# Patient Record
Sex: Male | Born: 1990 | Race: Black or African American | Hispanic: No | Marital: Single | Smoking: Current some day smoker
Health system: Southern US, Community
[De-identification: ages and names within clinical notes are randomized; demographics above are authoritative.]

## PROBLEM LIST (undated history)

## (undated) ENCOUNTER — Emergency Department (HOSPITAL_COMMUNITY)
Discharge status: Against Medical Advice | Payer: Medicaid Other | Attending: Emergency Medicine | Admitting: Emergency Medicine

## (undated) HISTORY — PX: CARDIAC SURGERY: SHX584

---
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
## 2013-01-17 IMAGING — CR DG SHOULDER 2+V*R*
3 series · 3 of 3 positions shown · non-contrast
Comparison: None.

CLINICAL DATA: Fall.  Shoulder injury with limited range of motion.

RIGHT SHOULDER - 2+ VIEW

[view not recorded (1 of 3)]
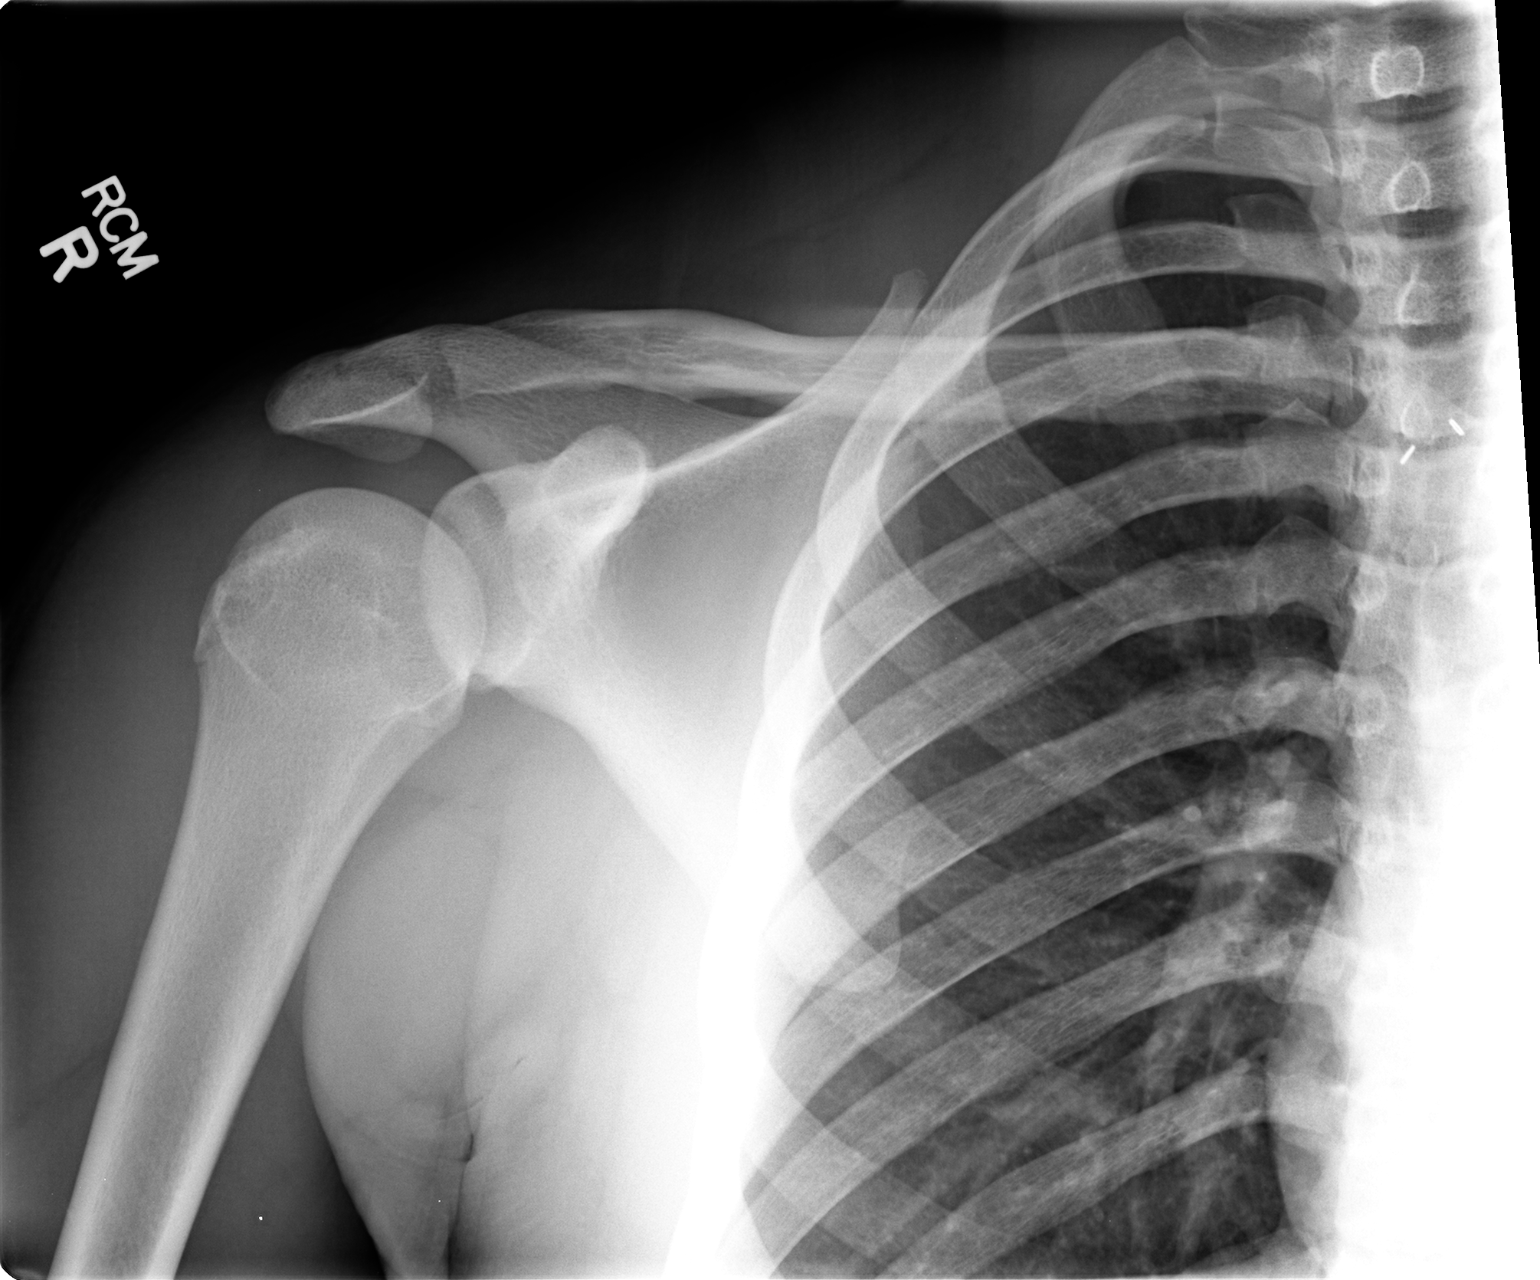

[view not recorded (2 of 3)]
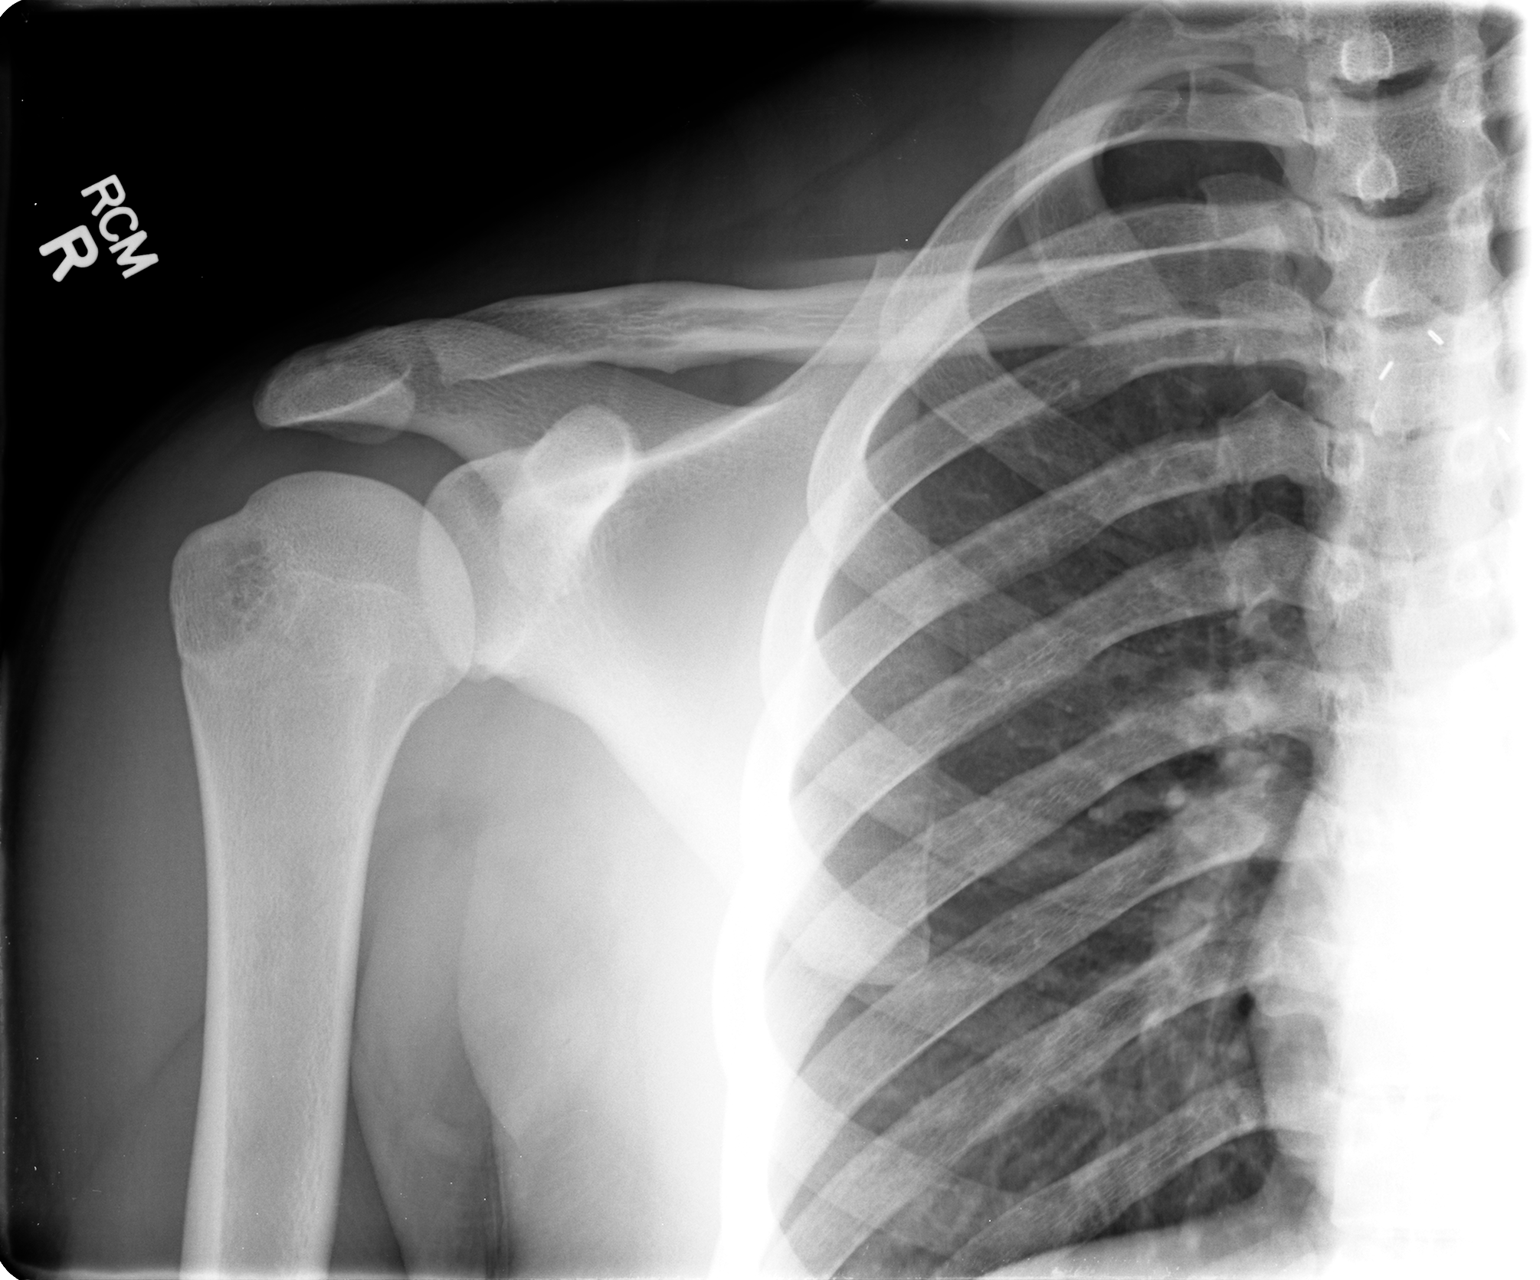

[view not recorded (3 of 3)]
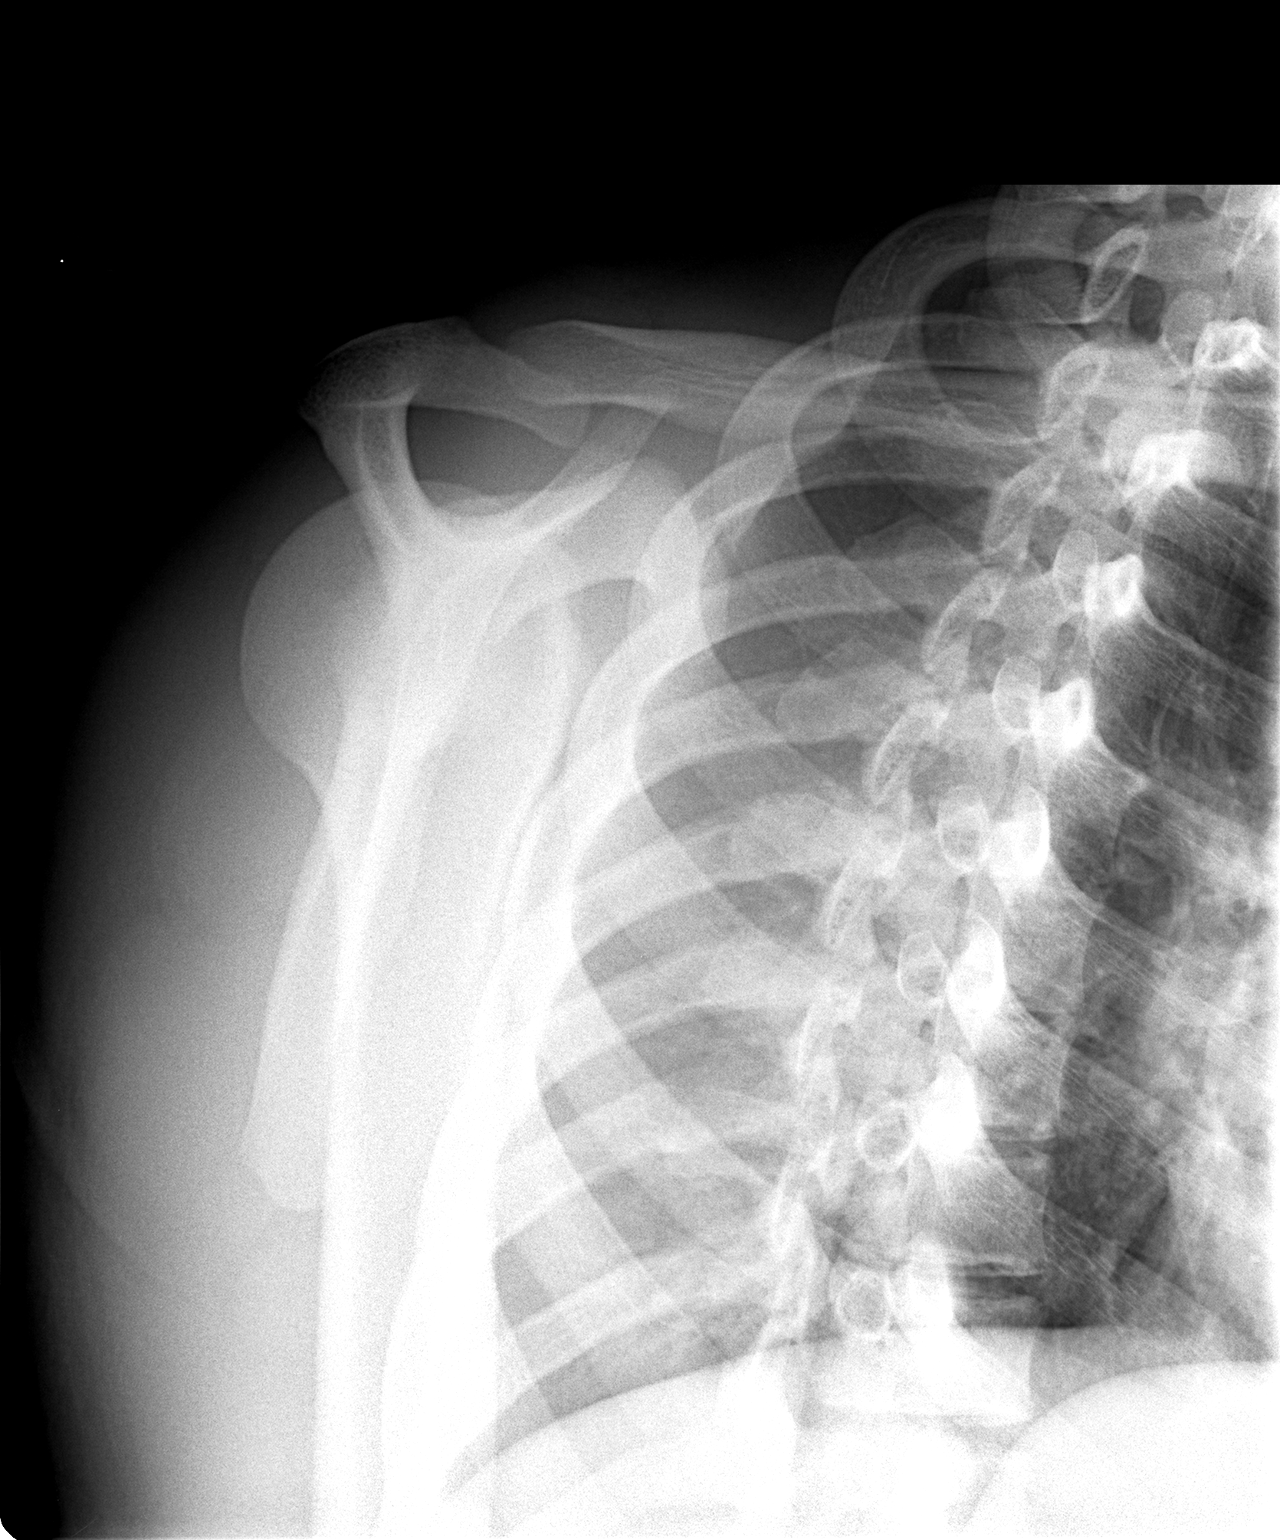

[3 of 3 positions shown; findings below may reference images not displayed]

FINDINGS: Slight irregularity of the acromion likely relates to the
acromial apophysis, which is normal at this age.  No definite AC
joint malalignment.  No fracture is identified.
IMPRESSION: No significant abnormality identified.

## 2013-11-01 ENCOUNTER — Encounter (HOSPITAL_COMMUNITY): Payer: Self-pay | Admitting: Emergency Medicine

## 2013-11-01 ENCOUNTER — Other Ambulatory Visit (HOSPITAL_COMMUNITY)
Admission: RE | Admit: 2013-11-01 | Discharge: 2013-11-01 | Disposition: A | Discharge status: Home / Self Care | Payer: 59 | Source: Ambulatory Visit | Attending: Family Medicine | Admitting: Family Medicine

## 2013-11-01 ENCOUNTER — Emergency Department (INDEPENDENT_AMBULATORY_CARE_PROVIDER_SITE_OTHER)
Admission: EM | Admit: 2013-11-01 | Discharge: 2013-11-01 | Disposition: A | Discharge status: Home / Self Care | Payer: 59 | Source: Home / Self Care

## 2013-11-01 DIAGNOSIS — Z202 Contact with and (suspected) exposure to infections with a predominantly sexual mode of transmission: Secondary | ICD-10-CM

## 2013-11-01 DIAGNOSIS — Z113 Encounter for screening for infections with a predominantly sexual mode of transmission: Secondary | ICD-10-CM | POA: Insufficient documentation

## 2013-11-01 LAB — POCT URINALYSIS DIP (DEVICE)
GLUCOSE, UA: NEGATIVE mg/dL
HGB URINE DIPSTICK: NEGATIVE
Ketones, ur: NEGATIVE mg/dL
LEUKOCYTES UA: NEGATIVE
NITRITE: NEGATIVE
PH: 7 (ref 5.0–8.0)
PROTEIN: NEGATIVE mg/dL
SPECIFIC GRAVITY, URINE: 1.025 (ref 1.005–1.030)
Urobilinogen, UA: 1 mg/dL (ref 0.0–1.0)

## 2013-11-01 NOTE — ED Notes (Signed)
Pt not in waiting area 

## 2013-11-01 NOTE — ED Provider Notes (Signed)
CSN: [id number]     Arrival date & time 11/01/13  1209 History   None    Chief Complaint  Patient presents with  . Exposure to STD   (Consider location/radiation/quality/duration/timing/severity/associated sxs/prior Treatment) Patient is a 23 y.o. male presenting with STD exposure. The history is provided by the patient.  Exposure to STD This is a new problem. Episode onset: told yest by girl that he should be checked for std. The problem has been gradually worsening (sl dysuria, no rash,lesions or d/c.). Pertinent negatives include no chest pain and no abdominal pain.    History reviewed. No pertinent past medical history. History reviewed. No pertinent past surgical history. No family history on file. History  Substance Use Topics  . Smoking status: Current Every Day Smoker -- 0.50 packs/day  . Smokeless tobacco: Not on file  . Alcohol Use: No    Review of Systems  Cardiovascular: Negative for chest pain.  Gastrointestinal: Negative for abdominal pain.  Genitourinary: Positive for dysuria. Negative for urgency, discharge, penile swelling, scrotal swelling, penile pain and testicular pain.    Allergies  Review of patient's allergies indicates no known allergies.  Home Medications  No current outpatient prescriptions on file. BP 111/62  Pulse 51  Temp(Src) 98.2 F (36.8 C) (Oral)  Resp 18  SpO2 100% Physical Exam  Nursing note and vitals reviewed. Constitutional: He is oriented to person, place, and time. He appears well-developed and well-nourished.  Abdominal: Soft. Bowel sounds are normal.  Genitourinary: Penis normal.  Neurological: He is alert and oriented to person, place, and time.  Skin: Skin is warm.    ED Course  Procedures (including critical care time) Labs Review Labs Reviewed  URINE CYTOLOGY ANCILLARY ONLY   Imaging Review No results found.   MDM   1. Possible exposure to STD        Linna HoffJames D Leiland Mihelich, MD 11/01/13 1357

## 2013-11-01 NOTE — Discharge Instructions (Signed)
We will call with test results and treat as indicated °

## 2013-11-01 NOTE — ED Notes (Signed)
Best number [phone]

## 2013-11-01 NOTE — ED Notes (Signed)
Pt reports he came in to be checked for STD. No recent exposure to STD. Reports he has noticed some discomfort while urinating and itching. Patient denies fever, n/v/d, or abdominal pain. Pt is alert and oriented and in no acute distress.

## 2013-11-02 LAB — URINE CYTOLOGY ANCILLARY ONLY
Chlamydia: NEGATIVE
Neisseria Gonorrhea: NEGATIVE
Trichomonas: NEGATIVE

## 2014-01-17 ENCOUNTER — Encounter (HOSPITAL_COMMUNITY): Payer: Self-pay | Admitting: Emergency Medicine

## 2014-01-17 ENCOUNTER — Emergency Department (HOSPITAL_COMMUNITY)
Admission: EM | Admit: 2014-01-17 | Discharge: 2014-01-17 | Disposition: A | Discharge status: Home / Self Care | Payer: 59 | Attending: Emergency Medicine | Admitting: Emergency Medicine

## 2014-01-17 DIAGNOSIS — B354 Tinea corporis: Secondary | ICD-10-CM | POA: Insufficient documentation

## 2014-01-17 DIAGNOSIS — Z79899 Other long term (current) drug therapy: Secondary | ICD-10-CM | POA: Diagnosis not present

## 2014-01-17 DIAGNOSIS — Z9889 Other specified postprocedural states: Secondary | ICD-10-CM | POA: Insufficient documentation

## 2014-01-17 DIAGNOSIS — F17200 Nicotine dependence, unspecified, uncomplicated: Secondary | ICD-10-CM | POA: Diagnosis not present

## 2014-01-17 DIAGNOSIS — R21 Rash and other nonspecific skin eruption: Secondary | ICD-10-CM | POA: Diagnosis present

## 2014-01-17 MED ORDER — FLUCONAZOLE 100 MG PO TABS
200.0000 mg | ORAL_TABLET | Freq: Once | ORAL | Status: AC
  Administered 2014-01-17: 200 mg via ORAL
  Filled 2014-01-17: qty 2

## 2014-01-17 MED ORDER — FLUCONAZOLE 150 MG PO TABS: 150.0000 mg | ORAL_TABLET | Freq: Every day | ORAL | Status: AC

## 2014-01-17 NOTE — ED Provider Notes (Signed)
CSN: [id number]     Arrival date & time 01/17/14  0030 History   First MD Initiated Contact with Patient 01/17/14 0058     Chief Complaint  Patient presents with  . Blister     (Consider location/radiation/quality/duration/timing/severity/associated sxs/prior Treatment) HPI Comments: Patient with multiple areas of itchy skin lesions noted around his right elbow, right upper arm and also back of neck. Patient states that they have started small and gradually grown larger. Patient has been scratching at the areas and picking at the areas. He has never had anything like this before. He denies new medications or skin exposures. He denies any lesions in his mouth. No known tick bites. No fever, nausea or vomiting. No treatments prior to arrival.  The history is provided by the patient.    History reviewed. No pertinent past medical history. Past Surgical History  Procedure Laterality Date  . Cardiac surgery     No family history on file. History  Substance Use Topics  . Smoking status: Current Some Day Smoker    Types: Cigarettes  . Smokeless tobacco: Not on file  . Alcohol Use: No    Review of Systems  Constitutional: Negative for fever.  HENT: Negative for facial swelling and trouble swallowing.   Eyes: Negative for redness.  Respiratory: Negative for shortness of breath, wheezing and stridor.   Cardiovascular: Negative for chest pain.  Gastrointestinal: Negative for nausea and vomiting.  Musculoskeletal: Negative for myalgias.  Skin: Positive for rash.  Neurological: Negative for light-headedness.  Psychiatric/Behavioral: Negative for confusion.    Allergies  Review of patient's allergies indicates no known allergies.  Home Medications   Prior to Admission medications   Medication Sig Start Date End Date Taking? Authorizing Provider  fluconazole (DIFLUCAN) 150 MG tablet Take 1 tablet (150 mg total) by mouth daily. 01/17/14   Renne CriglerJoshua Leshonda Galambos, PA-C  ibuprofen (ADVIL,MOTRIN)  200 MG tablet Take 400 mg by mouth every 6 (six) hours as needed. For pain.    Historical Provider, MD   BP 110/62  Pulse 58  Temp(Src) 98.1 F (36.7 C) (Oral)  Resp 18  Ht 5\' 8"  (1.727 m)  Wt 168 lb (76.204 kg)  BMI 25.55 kg/m2  SpO2 99%  Physical Exam  Nursing note and vitals reviewed. Constitutional: He appears well-developed and well-nourished.  HENT:  Head: Normocephalic and atraumatic.  Eyes: Conjunctivae are normal.  Neck: Normal range of motion. Neck supple.  Pulmonary/Chest: No respiratory distress.  Neurological: He is alert.  Skin: Skin is warm and dry.  Patient with multiple ovoid lesions with peripheral crusting and central clearing. He has several near his right elbow and right upper arm and a small location on the back of his neck. Areas are most consistent with tinea corporis. I do not see any areas of secondary infection or cellulitis.  Psychiatric: He has a normal mood and affect.    ED Course  Procedures (including critical care time) Labs Review Labs Reviewed - No data to display  Imaging Review No results found.   EKG Interpretation None      Vital signs reviewed and are as follows: Filed Vitals:   01/17/14 0045  BP: 110/62  Pulse: 58  Temp: 98.1 F (36.7 C)  Resp: 18     MDM   Final diagnoses:  Tinea corporis   Patient with signs and symptoms most consistent with ringworm. No concerning skin exposures. No signs of SJS or TEN. No signs of allergic reaction. No fevers or systemic symptoms  of illness.    Renne CriglerJoshua Taryll Reichenberger, PA-C 01/17/14 [phone]

## 2014-01-17 NOTE — Discharge Instructions (Signed)
Please read and follow all provided instructions.  Your diagnoses today include:  1. Tinea corporis     Tests performed today include:  Vital signs. See below for your results today.   Medications prescribed:   Diflucan - medication for fungal infection on body  Take any prescribed medications only as directed.  Home care instructions:  Follow any educational materials contained in this packet.  BE VERY CAREFUL not to take multiple medicines containing Tylenol (also called acetaminophen). Doing so can lead to an overdose which can damage your liver and cause liver failure and possibly death.   Follow-up instructions: Please follow-up with your primary care provider in the next 3 days for further evaluation of your symptoms.   Return instructions:   Please return to the Emergency Department if you experience worsening symptoms.   Please return if you have any other emergent concerns.  Additional Information:  Your vital signs today were: BP 110/62   Pulse 58   Temp(Src) 98.1 F (36.7 C) (Oral)   Resp 18   Ht 5\' 8"  (1.727 m)   Wt 168 lb (76.204 kg)   BMI 25.55 kg/m2   SpO2 99% If your blood pressure (BP) was elevated above 135/85 this visit, please have this repeated by your doctor within one month. --------------

## 2014-01-17 NOTE — ED Provider Notes (Signed)
Medical screening examination/treatment/procedure(s) were performed by non-physician practitioner and as supervising physician I was immediately available for consultation/collaboration.   EKG Interpretation None        Brandt LoosenJulie Manly, MD 01/17/14 2307

## 2014-01-17 NOTE — ED Notes (Signed)
The pt has a lesion to the rt  Elbow area that has been there for 4-5 days.  He has no idea what caused it

## 2014-06-07 ENCOUNTER — Encounter (HOSPITAL_COMMUNITY): Payer: Self-pay

## 2014-06-07 ENCOUNTER — Emergency Department (INDEPENDENT_AMBULATORY_CARE_PROVIDER_SITE_OTHER)
Admission: EM | Admit: 2014-06-07 | Discharge: 2014-06-07 | Disposition: A | Discharge status: Home / Self Care | Payer: BC Managed Care – PPO | Source: Home / Self Care | Attending: Family Medicine | Admitting: Family Medicine

## 2014-06-07 DIAGNOSIS — K629 Disease of anus and rectum, unspecified: Secondary | ICD-10-CM

## 2014-06-07 DIAGNOSIS — K602 Anal fissure, unspecified: Secondary | ICD-10-CM

## 2014-06-07 MED ORDER — POLYETHYLENE GLYCOL 3350 17 GM/SCOOP PO POWD: 17.0000 g | Freq: Every day | ORAL | Status: AC

## 2014-06-07 NOTE — ED Notes (Signed)
C/o ~ 10 day duration of episodic upper/transverse abdominal pain and blood in stools. NAD at present

## 2014-06-07 NOTE — ED Provider Notes (Addendum)
[name] is a 23 y.o. male who presents to Urgent Care today for Rectal bleeding. Patient had one or 2 episodes of bright red blood on his toilet paper over the past week or so. He denies any significant abdominal pain. No fevers or chills. Patient has had a few hard stools.   History reviewed. No pertinent past medical history. History reviewed. No pertinent past surgical history. History  Substance Use Topics  . Smoking status: Current Every Day Smoker -- 0.50 packs/day  . Smokeless tobacco: Not on file  . Alcohol Use: No   ROS as above Medications: No current facility-administered medications for this encounter.   Current Outpatient Prescriptions  Medication Sig Dispense Refill  . polyethylene glycol powder (GLYCOLAX/MIRALAX) powder Take 17 g by mouth daily. 850 g 1   No Known Allergies   Exam:  BP 103/65 mmHg  Pulse 54  Temp(Src) 97.6 F (36.4 C) (Oral)  Resp 16  SpO2 100% Gen: Well NAD HEENT: EOMI,  MMM Lungs: Normal work of breathing. CTABL Heart: mildly bradycardic but regular no MRG Abd: NABS, Soft. Nondistended, Nontender no rebound or guarding Exts: Brisk capillary refill, warm and well perfused.  Rectum: small anal fissure present at the 6:00 position.  No results found for this or any previous visit (from the past 24 hour(s)). No results found.  Assessment and Plan: 23 y.o. male with anal fissure. Treatment with near lax sitz bath and rest. follow-up with PCP.  Discussed warning signs or symptoms. Please see discharge instructions. Patient expresses understanding.     Rodolph BongEvan S Decari Duggar, MD 06/07/14 1129  Rodolph BongEvan S Lanier Millon, MD 06/07/14 [phone]

## 2014-06-07 NOTE — Discharge Instructions (Signed)
Thank you for coming in today. Follow up with your doctor.  If your belly pain worsens, or you have high fever, bad vomiting, blood in your stool or black tarry stool go to the Emergency Room.    Anal Fissure, Adult An anal fissure is a small tear or crack in the skin around the anus. Bleeding from a fissure usually stops on its own within a few minutes. However, bleeding will often reoccur with each bowel movement until the crack heals.  CAUSES   Passing large, hard stools.  Frequent diarrheal stools.  Constipation.  Inflammatory bowel disease (Crohn's disease or ulcerative colitis).  Infections.  Anal sex. SYMPTOMS   Small amounts of blood seen on your stools, on toilet paper, or in the toilet after a bowel movement.  Rectal bleeding.  Painful bowel movements.  Itching or irritation around the anus. DIAGNOSIS Your caregiver will examine the anal area. An anal fissure can usually be seen with careful inspection. A rectal exam may be performed and a short tube (anoscope) may be used to examine the anal canal. TREATMENT   You may be instructed to take fiber supplements. These supplements can soften your stool to help make bowel movements easier.  Sitz baths may be recommended to help heal the tear. Do not use soap in the sitz baths.  A medicated cream or ointment may be prescribed to lessen discomfort. HOME CARE INSTRUCTIONS   Maintain a diet high in fruits, whole grains, and vegetables. Avoid constipating foods like bananas and dairy products.  Take sitz baths as directed by your caregiver.  Drink enough fluids to keep your urine clear or pale yellow.  Only take over-the-counter or prescription medicines for pain, discomfort, or fever as directed by your caregiver. Do not take aspirin as this may increase bleeding.  Do not use ointments containing numbing medications (anesthetics) or hydrocortisone. They could slow healing. SEEK MEDICAL CARE IF:   Your fissure is  not completely healed within 3 days.  You have further bleeding.  You have a fever.  You have diarrhea mixed with blood.  You have pain.  Your problem is getting worse rather than better. MAKE SURE YOU:   Understand these instructions.  Will watch your condition.  Will get help right away if you are not doing well or get worse. Document Released: 07/09/2005 Document Revised: 10/01/2011 Document Reviewed: 12/24/2010 [facility] Patient Information 2015 [place], [place]. This information is not intended to replace advice given to you by your health care provider. Make sure you discuss any questions you have with your health care provider.  PRIMARY CARE Merchant navy officerDOCTORS [facility] [address]  [place], [place] Ph [phone]  Fax [phone]  Nature conservation officerLeBauer [facility] at [facility] [address]  [place], [place] Ph [phone]  Fax [phone]  Nature conservation officerLeBauer [facility]Care at [place] / [place] [phone]  [place], [place] Ph [phone]  Fax [phone]  [facility] [address]  [place], [place] Ph [phone]  Fax [phone]  [facility] [address] [place]  [place], [place] Ph [phone]  Fax [phone]  [facility] [address] [place]  [place], [place] Ph [phone]  Fax [phone]   [facility] [place] [address] [place] [address] Phone: [phone]   [facility] @ [facility] [address] [place] [address] Phone:  [phone]   [facility] @ [place] [address] [place] [address] Phone: [phone]   [facility] @ [place] [address] [place] [address] Phone: [phone]   [facility] @ [place] [address]  [address] [place] [address] Phone: [phone] Fax: [phone]   [facility] @ [place] [address] [place] [address] Phone: [phone]   Dr. Maryelizabeth RowanElizabeth Dewey [address] [place] [address] [phone]

## 2014-10-11 ENCOUNTER — Emergency Department (INDEPENDENT_AMBULATORY_CARE_PROVIDER_SITE_OTHER)
Admission: EM | Admit: 2014-10-11 | Discharge: 2014-10-11 | Disposition: A | Discharge status: Home / Self Care | Payer: Self-pay | Source: Home / Self Care | Attending: Emergency Medicine | Admitting: Emergency Medicine

## 2014-10-11 ENCOUNTER — Encounter (HOSPITAL_COMMUNITY): Payer: Self-pay | Admitting: Emergency Medicine

## 2014-10-11 DIAGNOSIS — L209 Atopic dermatitis, unspecified: Secondary | ICD-10-CM

## 2014-10-11 MED ORDER — TRIAMCINOLONE ACETONIDE 0.1 % EX CREA: 1.0000 "application " | TOPICAL_CREAM | Freq: Two times a day (BID) | CUTANEOUS | Status: AC

## 2014-10-11 NOTE — ED Provider Notes (Signed)
CSN: [id number]     Arrival date & time 10/11/14  1237 History   First MD Initiated Contact with Patient 10/11/14 1522     Chief Complaint  Patient presents with  . Eczema   (Consider location/radiation/quality/duration/timing/severity/associated sxs/prior Treatment) HPI Comments: 24 year old male with a history of eczema has about 1-2 flareups per year. Current flareup started about 2 months ago. He has been applying various OTC creams without improvement. He has darkened pigmentation and papules primarily to the flexor surfaces of bilateral elbows as well as headache circumferential band around his neck.   History reviewed. No pertinent past medical history. Past Surgical History  Procedure Laterality Date  . Cardiac surgery     No family history on file. History  Substance Use Topics  . Smoking status: Current Some Day Smoker    Types: Cigarettes  . Smokeless tobacco: Not on file  . Alcohol Use: No    Review of Systems  Constitutional: Negative.   Skin: Positive for rash.       Pruritic  Psychiatric/Behavioral: Negative.   All other systems reviewed and are negative.   Allergies  Review of patient's allergies indicates no known allergies.  Home Medications   Prior to Admission medications   Medication Sig Start Date End Date Taking? Authorizing Provider  fluconazole (DIFLUCAN) 150 MG tablet Take 1 tablet (150 mg total) by mouth daily. 01/17/14   Renne CriglerJoshua Geiple, PA-C  ibuprofen (ADVIL,MOTRIN) 200 MG tablet Take 400 mg by mouth every 6 (six) hours as needed. For pain.    Historical Provider, MD  triamcinolone cream (KENALOG) 0.1 % Apply 1 application topically 2 (two) times daily. 10/11/14   Hayden Rasmussenavid Analicia Skibinski, NP   BP 113/77 mmHg  Pulse 48  Temp(Src) 98.8 F (37.1 C) (Oral)  Resp 16  SpO2 96% Physical Exam  Constitutional: He is oriented to person, place, and time. He appears well-developed and well-nourished. No distress.  Neck: Normal range of motion. Neck supple.   Cardiovascular: Normal rate.   Pulmonary/Chest: Effort normal. No respiratory distress.  Musculoskeletal: He exhibits no edema.  Lymphadenopathy:    He has no cervical adenopathy.  Neurological: He is alert and oriented to person, place, and time. He exhibits normal muscle tone.  Skin: Skin is warm and dry.  Darkly pigmented and slightly rough rash to the flexor, lateral and medial aspects of the bilateral elbows. Similar-type rash around the neck.  Nursing note and vitals reviewed.   ED Course  Procedures (including critical care time) Labs Review Labs Reviewed - No data to display  Imaging Review No results found.   MDM   1. Atopic eczema    Triamcinolone twice a day as directed Apply emollients  such as non-fragrance skin lotions, frequently. Follow-up with a primary care doctor C first paging call the numbers to obtain one    Hayden Rasmussenavid Timouthy Gilardi, NP 10/11/14 1540

## 2014-10-11 NOTE — ED Notes (Signed)
C/o eczema flare up onset 2 months on both arms Alert, no signs of acute distress.

## 2014-10-11 NOTE — Discharge Instructions (Signed)
Eczema Eczema, also called atopic dermatitis, is a skin disorder that causes inflammation of the skin. It causes a red rash and dry, scaly skin. The skin becomes very itchy. Eczema is generally worse during the cooler winter months and often improves with the warmth of summer. Eczema usually starts showing signs in infancy. Some children outgrow eczema, but it may last through adulthood.  CAUSES  The exact cause of eczema is not known, but it appears to run in families. People with eczema often have a family history of eczema, allergies, asthma, or hay fever. Eczema is not contagious. Flare-ups of the condition may be caused by:   Contact with something you are sensitive or allergic to.   Stress. SIGNS AND SYMPTOMS  Dry, scaly skin.   Red, itchy rash.   Itchiness. This may occur before the skin rash and may be very intense.  DIAGNOSIS  The diagnosis of eczema is usually made based on symptoms and medical history. TREATMENT  Eczema cannot be cured, but symptoms usually can be controlled with treatment and other strategies. A treatment plan might include:  Controlling the itching and scratching.   Use over-the-counter antihistamines as directed for itching. This is especially useful at night when the itching tends to be worse.   Use over-the-counter steroid creams as directed for itching.   Avoid scratching. Scratching makes the rash and itching worse. It may also result in a skin infection (impetigo) due to a break in the skin caused by scratching.   Keeping the skin well moisturized with creams every day. This will seal in moisture and help prevent dryness. Lotions that contain alcohol and water should be avoided because they can dry the skin.   Limiting exposure to things that you are sensitive or allergic to (allergens).   Recognizing situations that cause stress.   Developing a plan to manage stress.  HOME CARE INSTRUCTIONS   Only take over-the-counter or  prescription medicines as directed by your health care provider.   Do not use anything on the skin without checking with your health care provider.   Keep baths or showers short (5 minutes) in warm (not hot) water. Use mild cleansers for bathing. These should be unscented. You may add nonperfumed bath oil to the bath water. It is best to avoid soap and bubble bath.   Immediately after a bath or shower, when the skin is still damp, apply a moisturizing ointment to the entire body. This ointment should be a petroleum ointment. This will seal in moisture and help prevent dryness. The thicker the ointment, the better. These should be unscented.   Keep fingernails cut short. Children with eczema may need to wear soft gloves or mittens at night after applying an ointment.   Dress in clothes made of cotton or cotton blends. Dress lightly, because heat increases itching.   A child with eczema should stay away from anyone with fever blisters or cold sores. The virus that causes fever blisters (herpes simplex) can cause a serious skin infection in children with eczema. SEEK MEDICAL CARE IF:   Your itching interferes with sleep.   Your rash gets worse or is not better within 1 week after starting treatment.   You see pus or soft yellow scabs in the rash area.   You have a fever.   You have a rash flare-up after contact with someone who has fever blisters.  Document Released: 07/06/2000 Document Revised: 04/29/2013 Document Reviewed: 02/09/2013 ExitCare Patient Information 2015 ExitCare, LLC. This information   is not intended to replace advice given to you by your health care provider. Make sure you discuss any questions you have with your health care provider.  

## 2015-11-28 ENCOUNTER — Encounter (HOSPITAL_COMMUNITY): Payer: Self-pay | Admitting: *Deleted

## 2015-11-28 ENCOUNTER — Ambulatory Visit (HOSPITAL_COMMUNITY)
Admission: EM | Admit: 2015-11-28 | Discharge: 2015-11-28 | Disposition: A | Discharge status: Home / Self Care | Payer: BLUE CROSS/BLUE SHIELD | Attending: Family Medicine | Admitting: Family Medicine

## 2015-11-28 DIAGNOSIS — B001 Herpesviral vesicular dermatitis: Secondary | ICD-10-CM | POA: Diagnosis not present

## 2015-11-28 MED ORDER — ACYCLOVIR 5 % EX OINT: 1.0000 "application " | TOPICAL_OINTMENT | CUTANEOUS | Status: AC

## 2015-11-28 MED ORDER — VALACYCLOVIR HCL 1 G PO TABS: ORAL_TABLET | ORAL | Status: AC

## 2015-11-28 NOTE — ED Provider Notes (Signed)
CSN: [id number]     Arrival date & time 11/28/15  1933 History   None    Chief Complaint  Patient presents with  . Mouth Lesions   (Consider location/radiation/quality/duration/timing/severity/associated sxs/prior Treatment) Patient is a 25 y.o. male presenting with mouth sores. The history is provided by the patient.  Mouth Lesions Location:  Lower lip Lower lip location:  L outer Quality:  Red and blistered Onset quality:  Sudden Severity:  Moderate Duration:  2 days Progression:  Worsening Chronicity:  New Relieved by:  Nothing Worsened by:  Nothing tried Ineffective treatments:  None tried   History reviewed. No pertinent past medical history. Past Surgical History  Procedure Laterality Date  . Cardiac surgery     History reviewed. No pertinent family history. Social History  Substance Use Topics  . Smoking status: Current Some Day Smoker    Types: Cigarettes  . Smokeless tobacco: None  . Alcohol Use: No    Review of Systems  Constitutional: Negative.   HENT: Positive for mouth sores.   Eyes: Negative.   Respiratory: Negative.   Cardiovascular: Negative.   Gastrointestinal: Negative.   Endocrine: Negative.   Genitourinary: Negative.   Musculoskeletal: Negative.   Skin: Negative.   Neurological: Negative.   Hematological: Negative.   Psychiatric/Behavioral: Negative.     Allergies  Review of patient's allergies indicates no known allergies.  Home Medications   Prior to Admission medications   Medication Sig Start Date End Date Taking? Authorizing Provider  acyclovir ointment (ZOVIRAX) 5 % Apply 1 application topically every 3 (three) hours. 11/28/15   Deatra CanterWilliam J Bastion Bolger, FNP  fluconazole (DIFLUCAN) 150 MG tablet Take 1 tablet (150 mg total) by mouth daily. 01/17/14   Renne CriglerJoshua Geiple, PA-C  ibuprofen (ADVIL,MOTRIN) 200 MG tablet Take 400 mg by mouth every 6 (six) hours as needed. For pain.    Historical Provider, MD  triamcinolone cream (KENALOG) 0.1 % Apply 1  application topically 2 (two) times daily. 10/11/14   Hayden Rasmussenavid Mabe, NP  valACYclovir (VALTREX) 1000 MG tablet Take 2 po twice a day for one day 11/28/15   Deatra CanterWilliam J Anahy Esh, FNP   Meds Ordered and Administered this Visit  Medications - No data to display  BP 127/70 mmHg  Pulse 75  Temp(Src) 99.3 F (37.4 C) (Oral)  Resp 18  SpO2 99% No data found.   Physical Exam  Constitutional: He appears well-developed and well-nourished.  HENT:  Head: Normocephalic.  Mouth/Throat: Oropharynx is clear and moist.  Cold sore left lower lip with blistering  Cardiovascular: Normal rate, regular rhythm and normal heart sounds.   Pulmonary/Chest: Effort normal and breath sounds normal.    ED Course  Procedures (including critical care time)  Labs Review Labs Reviewed - No data to display  Imaging Review No results found.   Visual Acuity Review  Right Eye Distance:   Left Eye Distance:   Bilateral Distance:    Right Eye Near:   Left Eye Near:    Bilateral Near:         MDM   1. Cold sore    Valtrex 1 gram 2 po bid x 1 days #4 Acyclovir cream apply q3 hours x 4 days #15 grams  Take tylenol and motrin otc prn as directed    Deatra CanterWilliam J Anner Baity, FNP 11/28/15 2133

## 2015-11-28 NOTE — ED Notes (Signed)
Pt  Reports      Lower  Lip      X  Several  Days               Symptoms  Not  reoleived  By  Cold  Sore  Cream    Pt  Sitting upright on  The  Exam table  Speaking  In  Complete  sentances

## 2018-02-04 ENCOUNTER — Ambulatory Visit (HOSPITAL_COMMUNITY)
Admission: EM | Admit: 2018-02-04 | Discharge: 2018-02-04 | Disposition: A | Discharge status: Home / Self Care | Payer: BLUE CROSS/BLUE SHIELD | Attending: Family Medicine | Admitting: Family Medicine

## 2018-02-04 ENCOUNTER — Encounter (HOSPITAL_COMMUNITY): Payer: Self-pay | Admitting: Emergency Medicine

## 2018-02-04 ENCOUNTER — Other Ambulatory Visit: Payer: Self-pay

## 2018-02-04 DIAGNOSIS — S61511A Laceration without foreign body of right wrist, initial encounter: Secondary | ICD-10-CM | POA: Diagnosis not present

## 2018-02-04 DIAGNOSIS — W25XXXA Contact with sharp glass, initial encounter: Secondary | ICD-10-CM | POA: Diagnosis not present

## 2018-02-04 NOTE — ED Provider Notes (Signed)
[facility]    CSN: [id number] Arrival date & time: 02/04/18  1446     History   Chief Complaint Chief Complaint  Patient presents with  . Laceration    right wrist    HPI [name] is a 27 y.o. male.   27 year old male comes in for laceration to the right wrist   That he sustained last night.  States he was trying to pick up broken glass table when he cut his forearm.  He was told to elevate his arm, and had caused a little bit of numbness, tingling, that has since resolved.  Is able to move his fingers without difficulty.  States he washed the wounds out, put antibiotic ointment, dressed it.  States he was told to come in and make sure he does not need stitches.  Tetanus up-to-date.     History reviewed. No pertinent past medical history.  There are no active problems to display for this patient.   History reviewed. No pertinent surgical history.     Home Medications    Prior to Admission medications   Medication Sig Start Date End Date Taking? Authorizing Provider  polyethylene glycol powder (GLYCOLAX/MIRALAX) powder Take 17 g by mouth daily. 06/07/14   Rodolph Bongorey, Evan S, MD    Family History History reviewed. No pertinent family history.  Social History Social History   Tobacco Use  . Smoking status: Current Every Day Smoker    Packs/day: 0.50    Types: Cigars  . Smokeless tobacco: Never Used  . Tobacco comment: Black & Milds 1-2 a day  Substance Use Topics  . Alcohol use: Yes  . Drug use: Not Currently     Allergies   Patient has no known allergies.   Review of Systems Review of Systems  Reason unable to perform ROS: See HPI as above.     Physical Exam Triage Vital Signs ED Triage Vitals  Enc Vitals Group     BP 02/04/18 1503 113/74     Pulse Rate 02/04/18 1503 62     Resp 02/04/18 1503 18     Temp 02/04/18 1503 98.6 F (37 C)     Temp Source 02/04/18 1503 Oral     SpO2 02/04/18 1503 100 %     Weight --      Height --       Head Circumference --      Peak Flow --      Pain Score 02/04/18 1508 0     Pain Loc --      Pain Edu? --      Excl. in GC? --    No data found.  Updated Vital Signs BP 113/74 (BP Location: Left Arm)   Pulse 62   Temp 98.6 F (37 C) (Oral)   Resp 18   SpO2 100%   Physical Exam  Constitutional: He is oriented to person, place, and time. He appears well-developed and well-nourished. No distress.  HENT:  Head: Normocephalic and atraumatic.  Eyes: Pupils are equal, round, and reactive to light. Conjunctivae are normal.  Musculoskeletal:  See picture below. Bleeding controlled.  Full range of motion of wrist, fingers.  Strength normal and equal bilaterally.  Sensation intact and equal bilaterally. Radial pulse 2+, Cap refill less than 2 seconds.  Neurological: He is alert and oriented to person, place, and time.  Skin: He is not diaphoretic.        UC Treatments / Results  Labs (all labs ordered are  listed, but only abnormal results are displayed) Labs Reviewed - No data to display  EKG None  Radiology No results found.  Procedures Laceration Repair Date/Time: 02/04/2018 3:54 PM Performed by: Belinda Fisher, PA-C Authorized by: Mardella Layman, MD   Consent:    Consent obtained:  Verbal   Consent given by:  Patient   Risks discussed:  Infection, pain, poor cosmetic result, poor wound healing, need for additional repair and nerve damage   Alternatives discussed:  No treatment and referral Anesthesia (see MAR for exact dosages):    Anesthesia method:  None Laceration details:    Location:  Shoulder/arm   Shoulder/arm location:  R lower arm   Wound length (cm): 3; 3; 2.   Depth (mm):  2 Repair type:    Repair type:  Simple Pre-procedure details:    Preparation:  Patient was prepped and draped in usual sterile fashion Exploration:    Hemostasis achieved with:  Direct pressure   Wound exploration: wound explored through full range of motion and entire depth of  wound probed and visualized   Treatment:    Area cleansed with:  Hibiclens   Amount of cleaning:  Standard   Irrigation solution:  Sterile saline   Irrigation method:  Pressure wash   Visualized foreign bodies/material removed: no   Skin repair:    Repair method:  Steri-Strips   Number of Steri-Strips:  19 Approximation:    Approximation:  Close Post-procedure details:    Dressing:  Non-adherent dressing   Patient tolerance of procedure:  Tolerated well, no immediate complications   (including critical care time)  Medications Ordered in UC Medications - No data to display  Initial Impression / Assessment and Plan / UC Course  I have reviewed the triage vital signs and the nursing notes.  Pertinent labs & imaging results that were available during my care of the patient were reviewed by me and considered in my medical decision making (see chart for details).    Discussed with patient, with appearance of laceration, most suitable for sutures.  Patient declined sutures, discussed risks and benefits of no treatment versus Steri-Strip versus Dermabond.  Patient agreeable to attempt Steri-Strips, and expresses understanding that it may fall off prior to wound healing.  Patient tolerated procedure well.  Wound care instructions given.  Return precautions given.  Patient expresses understanding and agrees to plan.  Final Clinical Impressions(s) / UC Diagnoses   Final diagnoses:  Laceration of right wrist, initial encounter    ED Prescriptions    None        Belinda Fisher, PA-C 02/04/18 1555

## 2018-02-04 NOTE — Discharge Instructions (Addendum)
As discussed. Steristrips applied. Please do not remove current dressing for the next 2-3 days. Do not get area wet. If dressing/strips fall off, just keep wound clean and dry, and it will heal slowly on its own. Otherwise, the strips will fall off on own once area is healed. Monitor for spreading redness, increased warmth, discharge, fever, follow up for reevaluation needed.

## 2018-02-04 NOTE — ED Notes (Signed)
Bed: UC06 Expected date:  Expected time:  Means of arrival:  Comments: For Dr Chaney MallingMortenson

## 2018-02-04 NOTE — ED Triage Notes (Signed)
Pt was picking up a broken glass table when he cut his right forearm, laterally.  He has two long lacerations.
